# Patient Record
Sex: Male | Born: 1965 | Race: White | Hispanic: No | Marital: Married | Smoking: Never smoker
Health system: Southern US, Community
[De-identification: ages and names within clinical notes are randomized; demographics above are authoritative.]

## PROBLEM LIST (undated history)

## (undated) DIAGNOSIS — R011 Cardiac murmur, unspecified: Secondary | ICD-10-CM

---

## 2007-07-17 ENCOUNTER — Emergency Department: Payer: Self-pay

## 2008-07-29 ENCOUNTER — Emergency Department: Payer: Self-pay | Admitting: Emergency Medicine

## 2008-12-02 ENCOUNTER — Emergency Department: Payer: Self-pay | Admitting: Emergency Medicine

## 2008-12-15 ENCOUNTER — Emergency Department: Payer: Self-pay | Admitting: Unknown Physician Specialty

## 2013-07-26 ENCOUNTER — Emergency Department: Payer: Self-pay | Admitting: Emergency Medicine

## 2015-12-09 ENCOUNTER — Emergency Department: Payer: BLUE CROSS/BLUE SHIELD

## 2015-12-09 ENCOUNTER — Emergency Department
Admission: EM | Admit: 2015-12-09 | Discharge: 2015-12-09 | Disposition: A | Discharge status: Home / Self Care | Payer: BLUE CROSS/BLUE SHIELD | Attending: Emergency Medicine | Admitting: Emergency Medicine

## 2015-12-09 ENCOUNTER — Encounter: Payer: Self-pay | Admitting: Emergency Medicine

## 2015-12-09 DIAGNOSIS — S8392XA Sprain of unspecified site of left knee, initial encounter: Secondary | ICD-10-CM | POA: Diagnosis not present

## 2015-12-09 DIAGNOSIS — S8992XA Unspecified injury of left lower leg, initial encounter: Secondary | ICD-10-CM | POA: Diagnosis present

## 2015-12-09 DIAGNOSIS — Y9289 Other specified places as the place of occurrence of the external cause: Secondary | ICD-10-CM | POA: Insufficient documentation

## 2015-12-09 DIAGNOSIS — M76892 Other specified enthesopathies of left lower limb, excluding foot: Secondary | ICD-10-CM | POA: Diagnosis not present

## 2015-12-09 DIAGNOSIS — X58XXXA Exposure to other specified factors, initial encounter: Secondary | ICD-10-CM | POA: Insufficient documentation

## 2015-12-09 DIAGNOSIS — M76899 Other specified enthesopathies of unspecified lower limb, excluding foot: Secondary | ICD-10-CM

## 2015-12-09 DIAGNOSIS — M25562 Pain in left knee: Secondary | ICD-10-CM

## 2015-12-09 DIAGNOSIS — Y9389 Activity, other specified: Secondary | ICD-10-CM | POA: Diagnosis not present

## 2015-12-09 DIAGNOSIS — Y998 Other external cause status: Secondary | ICD-10-CM | POA: Diagnosis not present

## 2015-12-09 HISTORY — DX: Cardiac murmur, unspecified: R01.1

## 2015-12-09 MED ORDER — KETOROLAC TROMETHAMINE 30 MG/ML IJ SOLN
30.0000 mg | Freq: Once | INTRAMUSCULAR | Status: AC
  Administered 2015-12-09: 30 mg via INTRAMUSCULAR
  Filled 2015-12-09: qty 1

## 2015-12-09 MED ORDER — NAPROXEN 500 MG PO TABS: 500.0000 mg | ORAL_TABLET | Freq: Two times a day (BID) | ORAL | Status: AC

## 2015-12-09 MED ORDER — KETOROLAC TROMETHAMINE 60 MG/2ML IM SOLN
INTRAMUSCULAR | Status: AC
  Filled 2015-12-09: qty 2

## 2015-12-09 NOTE — ED Notes (Signed)
Pt has left knee pain x 2 weeks.  Pt has not seen his pcp for the pain. Pain is to the inside of left knee

## 2015-12-09 NOTE — ED Notes (Signed)
Patient to ER for left knee pain x2 weeks. States he felt pop x4 and felt like it was "out of place", that he had to "pop it back in to place". Has had pain since.

## 2015-12-09 NOTE — Discharge Instructions (Signed)
Please use your crutches until seen by orthopedics.   Cryotherapy Cryotherapy is when you put ice on your injury. Ice helps lessen pain and puffiness (swelling) after an injury. Ice works the best when you start using it in the first 24 to 48 hours after an injury. HOME CARE  Put a dry or damp towel between the ice pack and your skin.  You may press gently on the ice pack.  Leave the ice on for no more than 10 to 20 minutes at a time.  Check your skin after 5 minutes to make sure your skin is okay.  Rest at least 20 minutes between ice pack uses.  Stop using ice when your skin loses feeling (numbness).  Do not use ice on someone who cannot tell you when it hurts. This includes small children and people with memory problems (dementia). GET HELP RIGHT AWAY IF:  You have white spots on your skin.  Your skin turns blue or pale.  Your skin feels waxy or hard.  Your puffiness gets worse. MAKE SURE YOU:   Understand these instructions.  Will watch your condition.  Will get help right away if you are not doing well or get worse.   This information is not intended to replace advice given to you by your health care provider. Make sure you discuss any questions you have with your health care provider.   Document Released: 03/11/2008 Document Revised: 12/16/2011 Document Reviewed: 05/16/2011 Elsevier Interactive Patient Education 2016 Elsevier Inc.  [employer] Therapy Heat therapy can help ease sore, stiff, injured, and tight muscles and joints. Heat relaxes your muscles, which may help ease your pain. Heat therapy should only be used on old, pre-existing, or long-lasting (chronic) injuries. Do not use heat therapy unless told by your doctor. HOW TO USE HEAT THERAPY There are several different kinds of heat therapy, including:  Moist heat pack.  Warm water bath.  Hot water bottle.  Electric heating pad.  Heated gel pack.  Heated wrap.  Electric heating pad. GENERAL HEAT  THERAPY RECOMMENDATIONS   Do not sleep while using heat therapy. Only use heat therapy while you are awake.  Your skin may turn pink while using heat therapy. Do not use heat therapy if your skin turns red.  Do not use heat therapy if you have new pain.  High heat or long exposure to heat can cause burns. Be careful when using heat therapy to avoid burning your skin.  Do not use heat therapy on areas of your skin that are already irritated, such as with a rash or sunburn. GET HELP IF:   You have blisters, redness, swelling (puffiness), or numbness.  You have new pain.  Your pain is worse. MAKE SURE YOU:  Understand these instructions.  Will watch your condition.  Will get help right away if you are not doing well or get worse.   This information is not intended to replace advice given to you by your health care provider. Make sure you discuss any questions you have with your health care provider.   Document Released: 12/16/2011 Document Revised: 10/14/2014 Document Reviewed: 11/16/2013 Elsevier Interactive Patient Education 2016 Elsevier Inc.  Tendinitis and Tenosynovitis  Tendinitis is inflammation of the tendon. Tenosynovitis is inflammation of the lining around the tendon (tendon sheath). These painful conditions often occur at once. Tendons attach muscle to bone. To move a limb, force from the muscle moves through the tendon, to the bone. These conditions often cause increased pain when moving.  Tendinitis may be caused by a small or partial tear in the tendon.  SYMPTOMS   Pain, tenderness, redness, bruising, or swelling at the injury.  Loss of normal joint movement.  Pain that gets worse with use of the muscle and joint attached to the tendon.  Weakness in the tendon, caused by calcium build up that may occur with tendinitis.  Commonly affected tendons:  Achilles tendon (calf of leg).  Rotator cuff (shoulder joint).  Patellar tendon (kneecap to shin).  Peroneal  tendon (ankle).  Posterior tibial tendon (inner ankle).  Biceps tendon (in front of shoulder). CAUSES   Sudden strain on a flexed muscle, muscle overuse, sudden increase or change in activity, vigorous activity.  Result of a direct hit (less common).  Poor muscle action (biomechanics). RISK INCREASES WITH:  Injury (trauma).  Too much exercise.  Sudden change in athletic activity.  Incorrect exercise form or technique.  Poor strength and flexibility.  Not warming-up properly before activity.  Returning to activity before healing is complete. PREVENTION   Warm-up and stretch properly before activity.  Maintain physical fitness:  Joint flexibility.  Muscle strength and endurance.  Fitness that increases heart rate.  Learn and use proper exercise techniques.  Use rehabilitation exercises to strengthen weak muscles and tendons.  Ice the tendon after activity, to reduce recurring inflammation.  Wear proper fitting protective equipment for specific tendons, when indicated. PROGNOSIS  When treated properly, can be cured in 6 to 8 weeks. Recovery may take longer, depending on degree of injury.  RELATED COMPLICATIONS   Re-injury or recurring symptoms.  Permanent weakness or joint stiffness, if injury is severe and recovery is not completed.  Delayed healing, if sports are started before healing is complete.  Tearing apart (rupture) of the inflamed tendon. Tendinitis means the tendon is injured and must recover. TREATMENT  Treatment first involves ice, medicine, and rest from aggravating activities. This reduces pain and inflammation. Modifying your activity may be considered to prevent recurring injury. A brace, elastic bandage wrap, splint, cast, or sling may be prescribed to protect the joint for a short period. After that period, strengthening and stretching exercise may help to regain strength and full range of motion. If the condition persists, despite  non-surgical treatment, surgery may be recommended to remove the inflamed tendon lining. Corticosteroid injections may be given to reduce inflammation. However, these injections may weaken the tendon and increase your risk for tendon rupture. MEDICATION   If pain medicine is needed, nonsteroidal anti-inflammatory medicines (aspirin and ibuprofen), or other minor pain relievers (acetaminophen), are often recommended.  Do not take pain medicine for 7 days before surgery.  Prescription pain relievers are usually prescribed only after surgery. Use only as directed and only as much as you need.  Ointments applied to the skin may be helpful.  Corticosteroid injections may be given to reduce inflammation. However, this may increase your risk of a tendon rupture. HEAT AND COLD  Cold treatment (icing) relieves pain and reduces inflammation. Cold treatment should be applied for 10 to 15 minutes every 2 to 3 hours, and immediately after activity that aggravates your symptoms. Use ice packs or an ice massage.  Heat treatment may be used before performing stretching and strengthening activities prescribed by your caregiver, physical therapist, or athletic trainer. Use a heat pack or a warm water soak. SEEK MEDICAL CARE IF:   Symptoms get worse or do not improve, despite treatment.  Pain becomes too much to tolerate.  You develop numbness or  tingling.  Toes become cold, or toenails become blue, gray, or dark colored.  New, unexplained symptoms develop. (Drugs used in treatment may produce side effects.)   This information is not intended to replace advice given to you by your health care provider. Make sure you discuss any questions you have with your health care provider.   Document Released: 09/23/2005 Document Revised: 12/16/2011 Document Reviewed: 01/05/2009 Elsevier Interactive Patient Education 2016 Elsevier Inc.  Knee Sprain A knee sprain is a tear in the strong bands of tissue that  connect the bones (ligaments) of your knee. HOME CARE  Raise (elevate) your injured knee to lessen puffiness (swelling).  To ease pain and puffiness, put ice on the injured area.  Put ice in a plastic bag.  Place a towel between your skin and the bag.  Leave the ice on for 20 minutes, 2-3 times a day.  Only take medicine as told by your doctor.  Do not leave your knee unprotected until pain and stiffness go away (usually 4-6 weeks).  If you have a cast or splint, do not get it wet. If your doctor told you to not take it off, cover it with a plastic bag when you shower or bathe. Do not swim.  Your doctor may have you do exercises to prevent or limit permanent weakness and stiffness. GET HELP RIGHT AWAY IF:   Your cast or splint becomes damaged.  Your pain gets worse.  You have a lot of pain, puffiness, or numbness below the cast or splint. MAKE SURE YOU:   Understand these instructions.  Will watch your condition.  Will get help right away if you are not doing well or get worse.   This information is not intended to replace advice given to you by your health care provider. Make sure you discuss any questions you have with your health care provider.   Document Released: 09/11/2009 Document Revised: 09/28/2013 Document Reviewed: 06/01/2013 Elsevier Interactive Patient Education [employer]

## 2015-12-09 NOTE — ED Provider Notes (Signed)
[facility] Emergency Department Provider Note  ____________________________________________  Time seen: Approximately 4:57 PM  I have reviewed the triage vital signs and the nursing notes.   HISTORY  Chief Complaint Knee Pain    HPI Marc Phillips is a 50 y.o. male , NAD, presents to the emergency department with complaint of left knee pain 2 weeks. States he felt a pop in the medial left knee a few times and it also felt like it was "out of place". Notes he had to "pop the knee back into place". Has had any was pain that increases when ambulating. Has utilized an Ace wrap as well as has been taking ibuprofen with no resolution of symptoms. Denies any lower extremity edema, redness, warmth. No open wounds or skin sores. Has not had any specific traumas or injuries to the left knee. Denies numbness, weakness, tingling.   Past Medical History  Diagnosis Date  . Heart murmur     There are no active problems to display for this patient.   History reviewed. No pertinent past surgical history.  Current Outpatient Rx  Name  Route  Sig  Dispense  Refill  . naproxen (NAPROSYN) 500 MG tablet   Oral   Take 1 tablet (500 mg total) by mouth 2 (two) times daily with a meal.   14 tablet   0     Allergies Review of patient's allergies indicates no known allergies.  No family history on file.  Social History Social History  Substance Use Topics  . Smoking status: Never Smoker   . Smokeless tobacco: None  . Alcohol Use: No     Review of Systems  Constitutional: No fever/chills Cardiovascular: No chest pain. Respiratory: No shortness of breath. Gastrointestinal: No abdominal pain.  No nausea, vomiting.   Musculoskeletal: Positive left knee pain. Negative left hip or lower extremity pain. Negative back pain. Skin: Positive left knee swelling. Negative for rash or redness, warmth. Neurological: Negative for headaches, focal weakness or numbness. 10-point  ROS otherwise negative.  ____________________________________________   PHYSICAL EXAM:  VITAL SIGNS: ED Triage Vitals  Enc Vitals Group     BP 12/09/15 1548 135/65 mmHg     Pulse Rate 12/09/15 1548 73     Resp 12/09/15 1548 18     Temp 12/09/15 1548 97.7 F (36.5 C)     Temp Source 12/09/15 1548 Oral     SpO2 12/09/15 1548 97 %     Weight 12/09/15 1548 170 lb (77.111 kg)     Height 12/09/15 1548  (1.753 m)     Head Cir --      Peak Flow --      Pain Score 12/09/15 1548 8     Pain Loc --      Pain Edu? --      Excl. in GC? --     Constitutional: Alert and oriented. Well appearing and in no acute distress. Eyes: Conjunctivae are normal. PERRL. EOMI without pain.  Head: Atraumatic. Cardiovascular:  Good peripheral circulation. Respiratory: Normal respiratory effort without tachypnea or retractions.  Musculoskeletal: Pain to medial left knee with palpation. Full range of motion with no laxity. No crepitus about the left knee with manipulation. No lower extremity tenderness nor edema.  No joint effusions. Neurologic:  Normal speech and language. No gross focal neurologic deficits are appreciated.  Skin:  Skin is warm, dry and intact. No rash noted. Psychiatric: Mood and affect are normal. Speech and behavior are normal. Patient exhibits  appropriate insight and judgement.   ____________________________________________   LABS  None  ____________________________________________  EKG  None ____________________________________________  RADIOLOGY I have personally viewed and evaluated these images (plain radiographs) as part of my medical decision making, as well as reviewing the written report by the radiologist.  Dg Knee Complete 4 Views Left  12/09/2015  CLINICAL DATA:  Left knee pain for 2 weeks EXAM: LEFT KNEE - COMPLETE 4+ VIEW COMPARISON:  None. FINDINGS: Evidence of bipartite patella, an anatomic variant. No fracture or dislocation. Minimal narrowing of the  joint. No joint effusion. IMPRESSION: No acute findings.  Bipartite patella noted. Electronically Signed   By: Esperanza Heiraymond  Rubner M.D.   On: 12/09/2015 16:33    ____________________________________________    PROCEDURES  Procedure(s) performed: None    Medications  ketorolac (TORADOL) 30 MG/ML injection 30 mg (30 mg Intramuscular Given 12/09/15 1710)     ____________________________________________   INITIAL IMPRESSION / ASSESSMENT AND PLAN / ED COURSE  Pertinent labs & imaging results that were available during my care of the patient were reviewed by me and considered in my medical decision making (see chart for details).  Patient's diagnosis is consistent with left knee sprain with potential tendinitis. Patient will be discharged home with prescriptions for proximal and 500 mg to take one tablet by mouth twice daily as needed with a meal. Continue utilizing Ace wrap and crutches that he had home to decrease stress on the left knee. Ice to the area 20 minutes 3-4 times daily and may alternate with heat as needed. Patient is to follow up with Dr. Martha ClanKrasinski in orthopedics if symptoms persist past this treatment course. Patient is given ED precautions to return to the ED for any worsening or new symptoms.    ____________________________________________  FINAL CLINICAL IMPRESSION(S) / ED DIAGNOSES  Final diagnoses:  Tendinitis of knee  Medial knee pain, left  Left knee sprain, initial encounter      NEW MEDICATIONS STARTED DURING THIS VISIT:  New Prescriptions   NAPROXEN (NAPROSYN) 500 MG TABLET    Take 1 tablet (500 mg total) by mouth 2 (two) times daily with a meal.         Hope PigeonJami L Hagler, PA-C 12/09/15 1718  Jene Everyobert Kinner, MD 12/09/15 2239

## 2016-02-03 ENCOUNTER — Emergency Department
Admission: EM | Admit: 2016-02-03 | Discharge: 2016-02-03 | Disposition: A | Discharge status: Home / Self Care | Payer: BLUE CROSS/BLUE SHIELD | Attending: Emergency Medicine | Admitting: Emergency Medicine

## 2016-02-03 ENCOUNTER — Encounter: Payer: Self-pay | Admitting: Emergency Medicine

## 2016-02-03 DIAGNOSIS — H6692 Otitis media, unspecified, left ear: Secondary | ICD-10-CM | POA: Diagnosis not present

## 2016-02-03 DIAGNOSIS — H9202 Otalgia, left ear: Secondary | ICD-10-CM | POA: Diagnosis present

## 2016-02-03 MED ORDER — AMOXICILLIN 875 MG PO TABS: 875.0000 mg | ORAL_TABLET | Freq: Two times a day (BID) | ORAL | Status: AC

## 2016-02-03 NOTE — Discharge Instructions (Signed)
Otitis Media, Adult °Otitis media is redness, soreness, and puffiness (swelling) in the space just behind your eardrum (middle ear). It may be caused by allergies or infection. It often happens along with a cold. °HOME CARE °· Take your medicine as told. Finish it even if you start to feel better. °· Only take over-the-counter or prescription medicines for pain, discomfort, or fever as told by your doctor. °· Follow up with your doctor as told. °GET HELP IF: °· You have otitis media only in one ear, or bleeding from your nose, or both. °· You notice a lump on your neck. °· You are not getting better in 3-5 days. °· You feel worse instead of better. °GET HELP RIGHT AWAY IF:  °· You have pain that is not helped with medicine. °· You have puffiness, redness, or pain around your ear. °· You get a stiff neck. °· You cannot move part of your face (paralysis). °· You notice that the bone behind your ear hurts when you touch it. °MAKE SURE YOU:  °· Understand these instructions. °· Will watch your condition. °· Will get help right away if you are not doing well or get worse. °  °This information is not intended to replace advice given to you by your health care provider. Make sure you discuss any questions you have with your health care provider. °  °Document Released: 03/11/2008 Document Revised: 10/14/2014 Document Reviewed: 04/20/2013 °Elsevier Interactive Patient Education ©2016 Elsevier Inc. ° ° °

## 2016-02-03 NOTE — ED Notes (Signed)
Pt presents to ED with reports of left ear pain that is causing left cheek pain. Pt reports intermittent nasal congestion. Pt denies sore throat.

## 2016-02-03 NOTE — ED Provider Notes (Signed)
[facility] Emergency Department Provider Note  ____________________________________________  Time seen: Approximately 6:13 PM  I have reviewed the triage vital signs and the nursing notes.   HISTORY  Chief Complaint Otalgia    HPI Marc Phillips is a 50 y.o. male , NAD, presents to the emergency department with 1 week history of left ear pain. States he has had nasal congestion, runny nose, sinus pressure and ear pressure over the last week. Has only been taking ibuprofen and no other medications for her symptoms. Notes the last 2 days his left ear pain has increased severely. Has not noted any discharge from the ear. Denies any fever, chills, body aches. No known sick contacts. No tinnitus or change in hearing.   Past Medical History  Diagnosis Date  . Heart murmur     There are no active problems to display for this patient.   History reviewed. No pertinent past surgical history.  Current Outpatient Rx  Name  Route  Sig  Dispense  Refill  . amoxicillin (AMOXIL) 875 MG tablet   Oral   Take 1 tablet (875 mg total) by mouth 2 (two) times daily.   20 tablet   0   . naproxen (NAPROSYN) 500 MG tablet   Oral   Take 1 tablet (500 mg total) by mouth 2 (two) times daily with a meal.   14 tablet   0     Allergies Review of patient's allergies indicates no known allergies.  No family history on file.  Social History Social History  Substance Use Topics  . Smoking status: Never Smoker   . Smokeless tobacco: None  . Alcohol Use: No     Review of Systems  Constitutional: No fever/chills, fatigue Eyes: No visual changes. No discharge is swelling, redness ENT: Positive left ear pain, nasal congestion, runny nose or sinus pressure. No sore throat. Cardiovascular: No chest pain. Respiratory: No cough. No shortness of breath. No wheezing.  Musculoskeletal: Negative for general myalgias.  Skin: Negative for rash. Neurological: Negative for  headaches, focal weakness or numbness. Tingling 10-point ROS otherwise negative.  ____________________________________________   PHYSICAL EXAM:  VITAL SIGNS: ED Triage Vitals  Enc Vitals Group     BP 02/03/16 1700 142/78 mmHg     Pulse Rate 02/03/16 1700 58     Resp 02/03/16 1700 18     Temp 02/03/16 1700 97.5 F (36.4 C)     Temp Source 02/03/16 1700 Oral     SpO2 02/03/16 1700 98 %     Weight 02/03/16 1700 172 lb (78.019 kg)     Height 02/03/16 1700 5\' 9"  (1.753 m)     Head Cir --      Peak Flow --      Pain Score 02/03/16 1700 8     Pain Loc --      Pain Edu? --      Excl. in GC? --      Constitutional: Alert and oriented. Well appearing and in no acute distress. Eyes: Conjunctivae are normal. Head: Atraumatic. ENT:      Ears: Left TM visualized with moderate erythema and mild bulging but no perforation or effusion. Light reflex is dull. Right TM visualized with mild serous effusion but no bulging, erythema or perforation. Light reflex is within normal limits.      Nose: Moderate congestion with moderate clear rhinnorhea.      Mouth/Throat: Mucous membranes are moist. Pharynx without erythema, swelling, exudate. Neck: Supple with full range  of motion. Hematological/Lymphatic/Immunilogical: No cervical lymphadenopathy. Cardiovascular: Normal rate, regular rhythm. Normal S1 and S2.  Respiratory: Normal respiratory effort without tachypnea or retractions. Lungs CTAB with breath sounds noted in all lung fields. Neurologic:  Normal speech and language. No gross focal neurologic deficits are appreciated.  Skin:  Skin is warm, dry and intact. No rash noted. Psychiatric: Mood and affect are normal. Speech and behavior are normal. Patient exhibits appropriate insight and judgement.   ____________________________________________    LABS  None ____________________________________________  EKG  None ____________________________________________  RADIOLOGY  None ____________________________________________    PROCEDURES  Procedure(s) performed: None   Medications - No data to display   ____________________________________________   INITIAL IMPRESSION / ASSESSMENT AND PLAN / ED COURSE  Patient's diagnosis is consistent with acute left otitis media. Patient will be discharged home with prescriptions for amoxicillin to take as directed. Patient may continue to take ibuprofen as needed for pain. Patient was given a work note to excuse from work this evening. Patient is to follow up with his primary care provider or [facility] if symptoms persist past this treatment course. Patient is given ED precautions to return to the ED for any worsening or new symptoms.    ____________________________________________  FINAL CLINICAL IMPRESSION(S) / ED DIAGNOSES  Final diagnoses:  Acute left otitis media, recurrence not specified, unspecified otitis media type      NEW MEDICATIONS STARTED DURING THIS VISIT:  New Prescriptions   AMOXICILLIN (AMOXIL) 875 MG TABLET    Take 1 tablet (875 mg total) by mouth 2 (two) times daily.         Hope Pigeon, PA-C 02/03/16 1819  Governor Rooks, MD 02/04/16 [phone]

## 2016-12-27 IMAGING — CR DG KNEE COMPLETE 4+V*L*
1 series · 4 of 4 positions shown · non-contrast
Comparison: None.

CLINICAL DATA: Left knee pain for 2 weeks

EXAM:
LEFT KNEE - COMPLETE 4+ VIEW

[Series 1: dg knee complete 4 views left · 0.14mm/px · 4 of 4 slices shown]
[im 1/4]
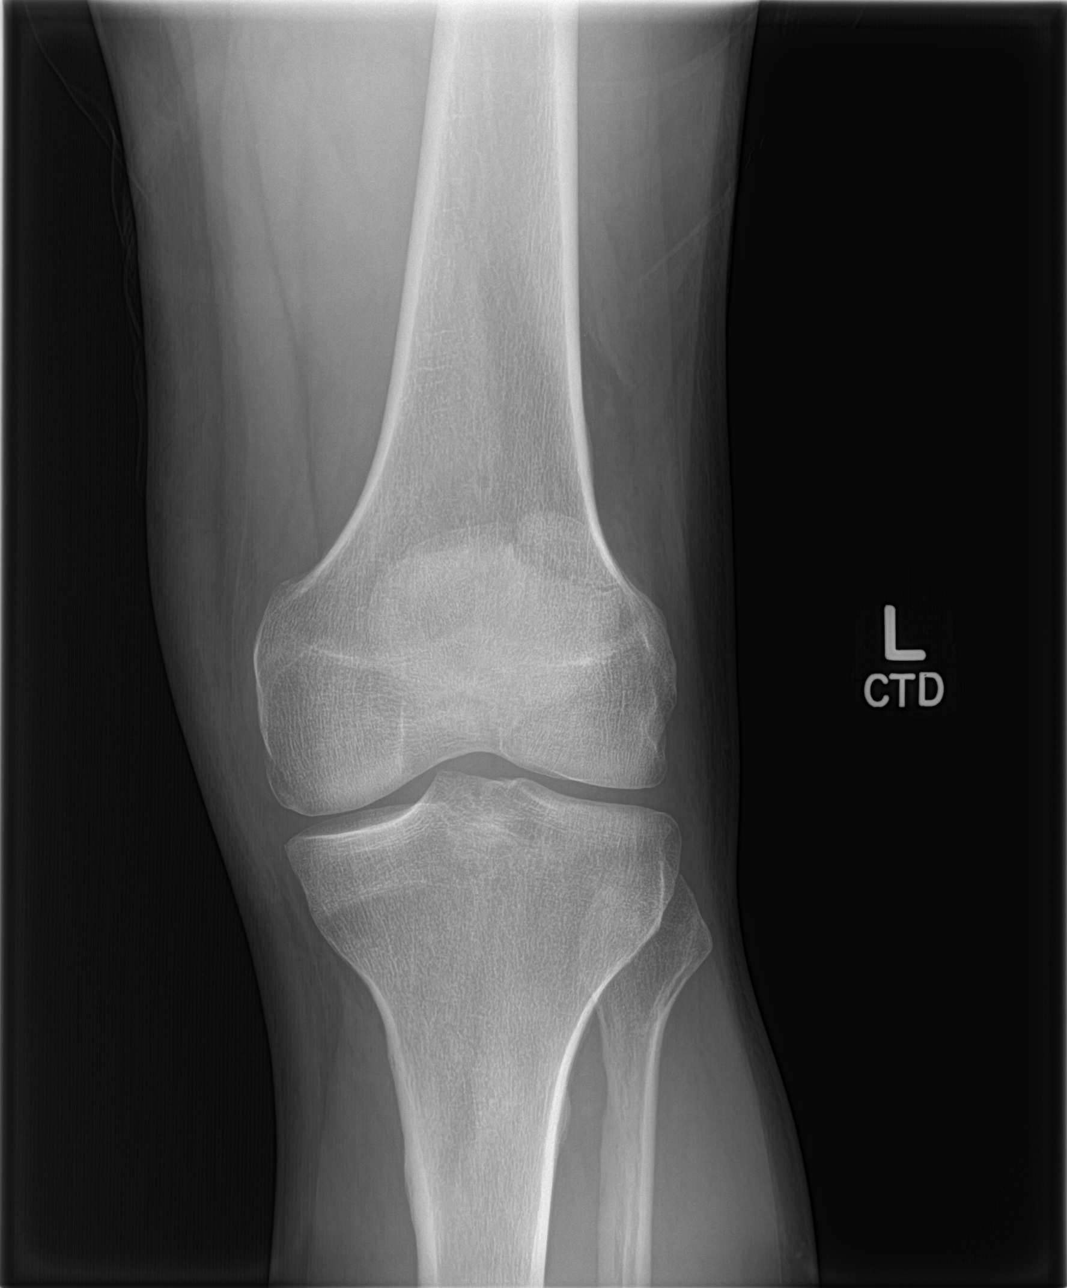
[im 2/4]
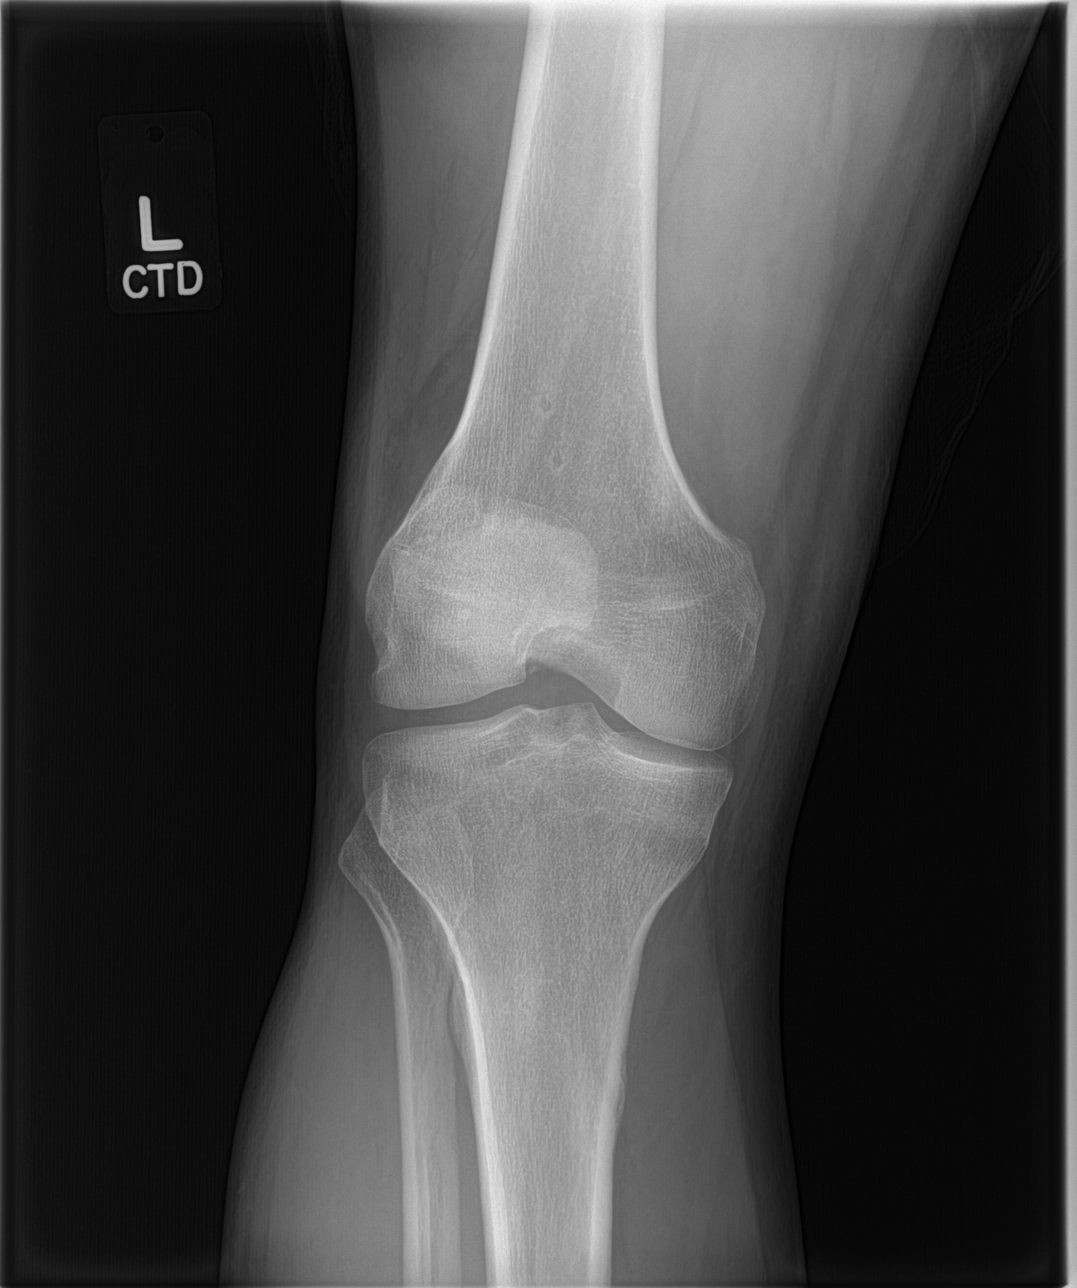
[im 3/4]
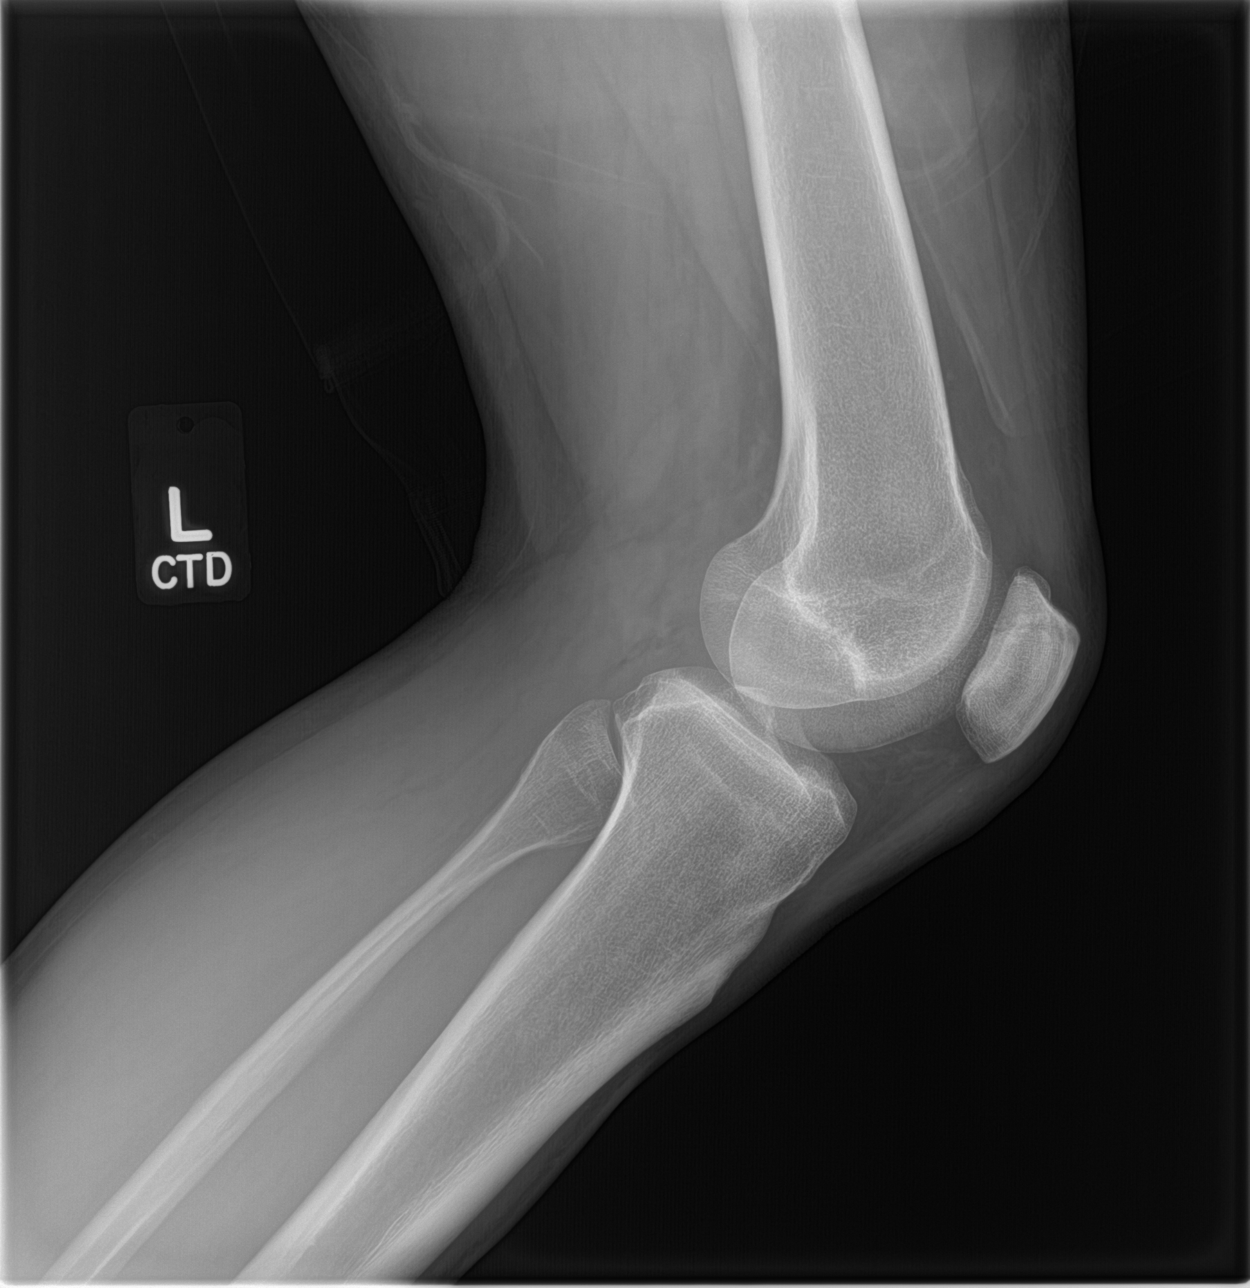
[im 4/4]
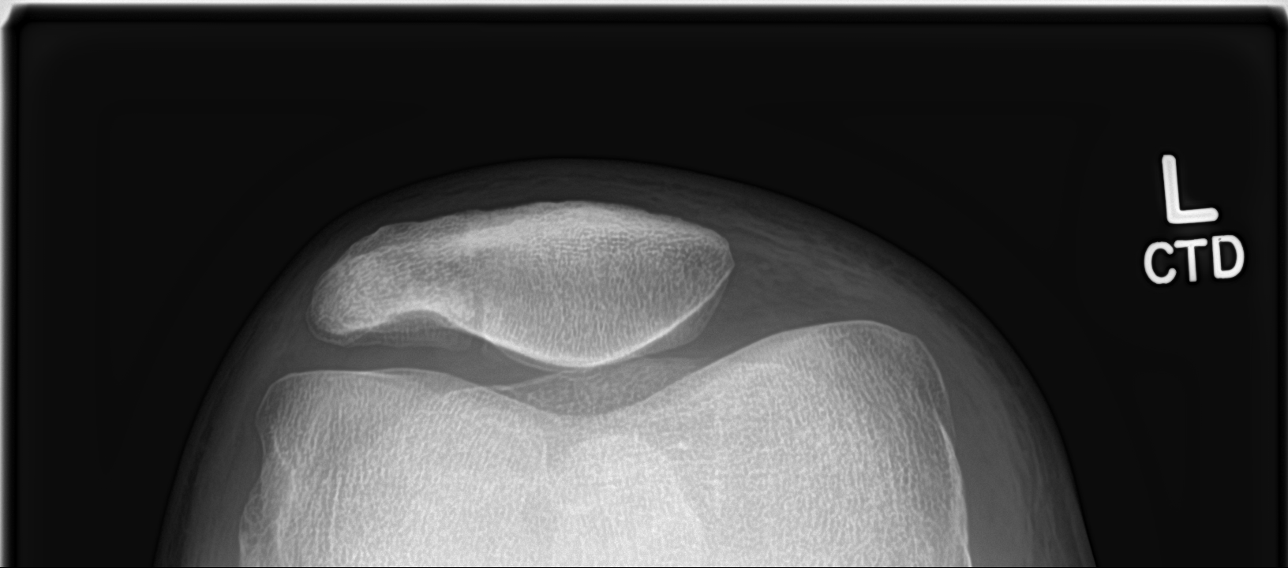

[4 of 4 positions shown; findings below may reference images not displayed]

FINDINGS: Evidence of bipartite patella, an anatomic variant. No fracture or
dislocation. Minimal narrowing of the joint. No joint effusion.
IMPRESSION: No acute findings.  Bipartite patella noted.
